# Patient Record
Sex: Male | Born: 1975 | Race: White | Hispanic: No | Marital: Married | Smoking: Never smoker
Health system: Southern US, Community
[De-identification: ages and names within clinical notes are randomized; demographics above are authoritative.]

## PROBLEM LIST (undated history)

## (undated) HISTORY — PX: BACK SURGERY: SHX140

---

## 2004-11-23 ENCOUNTER — Emergency Department: Payer: Self-pay | Admitting: Emergency Medicine

## 2004-12-04 ENCOUNTER — Emergency Department: Payer: Self-pay | Admitting: Emergency Medicine

## 2006-03-23 ENCOUNTER — Emergency Department: Payer: Self-pay | Admitting: Unknown Physician Specialty

## 2012-04-27 ENCOUNTER — Emergency Department: Payer: Self-pay | Admitting: Emergency Medicine

## 2013-10-08 ENCOUNTER — Ambulatory Visit: Payer: Self-pay

## 2013-10-08 LAB — RAPID INFLUENZA A&B ANTIGENS

## 2015-04-07 ENCOUNTER — Ambulatory Visit: Payer: 59

## 2015-04-07 ENCOUNTER — Ambulatory Visit
Admission: EM | Admit: 2015-04-07 | Discharge: 2015-04-07 | Disposition: A | Discharge status: Home / Self Care | Payer: 59 | Attending: Internal Medicine | Admitting: Internal Medicine

## 2015-04-07 ENCOUNTER — Encounter: Payer: Self-pay | Admitting: Emergency Medicine

## 2015-04-07 DIAGNOSIS — R1032 Left lower quadrant pain: Secondary | ICD-10-CM

## 2015-04-07 DIAGNOSIS — R109 Unspecified abdominal pain: Secondary | ICD-10-CM | POA: Diagnosis not present

## 2015-04-07 DIAGNOSIS — K5732 Diverticulitis of large intestine without perforation or abscess without bleeding: Secondary | ICD-10-CM

## 2015-04-07 DIAGNOSIS — Z79899 Other long term (current) drug therapy: Secondary | ICD-10-CM | POA: Diagnosis not present

## 2015-04-07 MED ORDER — CIPROFLOXACIN HCL 500 MG PO TABS: 500.0000 mg | ORAL_TABLET | Freq: Two times a day (BID) | ORAL | Status: DC

## 2015-04-07 MED ORDER — METRONIDAZOLE 500 MG PO TABS: 500.0000 mg | ORAL_TABLET | Freq: Three times a day (TID) | ORAL | Status: DC

## 2015-04-07 NOTE — Discharge Instructions (Signed)

## 2015-04-07 NOTE — ED Notes (Signed)
Patient states he "began having left lower abdominal pain on Tuesday, it has gradually gotten worse, movement makes it worse"

## 2015-04-07 NOTE — ED Provider Notes (Signed)
CSN: [id number]     Arrival date & time 04/07/15  1249 History   First MD Initiated Contact with Patient 04/07/15 1318     Chief Complaint  Patient presents with  . Abdominal Pain   (Consider location/radiation/quality/duration/timing/severity/associated sxs/prior Treatment) HPI  This a 39 year old male who presents with the acute onset on Tuesday, 2 days ago, of left lower quadrant abdominal pain. He indicates the periumbilical level on the left side. He denies any radiation of the pain but the pain is certainly exacerbated motion of changing positions especially when attempting to lie flat. His had nausea but no vomiting. Is also had some loose stools with blood but this sometimes happens because of a hemorrhoid that he has had. He has noticed that he has to have a bowel movement shortly after eating usually in the 30 minute time span. Last night he was miserable was unable to sleep because he tossed and turned all night with the pain. He states that they have been eating a large amount of corn along with other farm vegetables. Denies any fever or chills. He had been seen about 1 year ago with intestinal issues which initially was thought to be parasites.. Performed by gastroenterologist at the [facility]. History reviewed. No pertinent past medical history. Past Surgical History  Procedure Laterality Date  . Back surgery     History reviewed. No pertinent family history. History  Substance Use Topics  . Smoking status: Never Smoker   . Smokeless tobacco: Former Neurosurgeon  . Alcohol Use: Yes     Comment: occasionally    Review of Systems  Gastrointestinal: Positive for nausea and abdominal pain.  All other systems reviewed and are negative.   Allergies  Review of patient's allergies indicates no known allergies.  Home Medications   Prior to Admission medications   Medication Sig Start Date End Date Taking? Authorizing Provider  ciprofloxacin (CIPRO) 500 MG tablet Take 1 tablet  (500 mg total) by mouth 2 (two) times daily. 04/07/15   Lutricia Feil, PA-C  metroNIDAZOLE (FLAGYL) 500 MG tablet Take 1 tablet (500 mg total) by mouth 3 (three) times daily. 04/07/15   Chrissie Noa Annalese Stiner, PA-C   BP 132/77 mmHg  Pulse 78  Temp(Src) 97.6 F (36.4 C) (Oral)  Resp 16  Ht 5\' 10"  (1.778 m)  Wt 195 lb (88.451 kg)  BMI 27.98 kg/m2  SpO2 98% Physical Exam  Constitutional: He is oriented to person, place, and time. He appears well-developed and well-nourished. No distress.  HENT:  Head: Normocephalic and atraumatic.  Neck: Neck supple. No thyromegaly present.  Cardiovascular: Normal rate, regular rhythm, normal heart sounds and intact distal pulses.  Exam reveals no gallop and no friction rub.   No murmur heard. Pulmonary/Chest: Breath sounds normal. No stridor. No respiratory distress. He has no wheezes. He has no rales.  Abdominal: Soft. He exhibits no distension. There is tenderness. There is guarding. There is no rebound.  Examination the abdomen shows the patient to go slowly from a sitting to a lying position because of pain. He grimaces throughout the maneuver. Abdomen does not appear distended but the abdominal wall is tense but not guarded. He prefers to have his legs drawn up flexing at the hips to take off the pressure on the abdominal wall. Bowel sounds are relatively absent in the left lower quadrant but present in the remainder of the bowel although hypotonic. The tenderness is in the left side maximal just distal to the periumbilical area sharply  localized and a small area of approximately 5 cm or so. He demonstrates no rebound ,Rosvick's sign or any masses.  Lymphadenopathy:    He has no cervical adenopathy.  Neurological: He is alert and oriented to person, place, and time.  Skin: Skin is warm and dry. No rash noted. He is not diaphoretic.  Psychiatric: He has a normal mood and affect. His behavior is normal. Judgment and thought content normal.  Nursing note and  vitals reviewed.   ED Course  Procedures (including critical care time) Labs Review Labs Reviewed - No data to display  Imaging Review Dg Abd Acute W/chest  04/07/2015   CLINICAL DATA:  Left lower quadrant pain for 3 days  EXAM: DG ABDOMEN ACUTE W/ 1V CHEST  COMPARISON:  None.  FINDINGS: No definite active infiltrate or effusion is seen. A vague opacity in the right upper lung field may correspond to a healing fracture involving the right anterior second rib but comparison with prior chest x-ray or followup chest x-ray is recommended if available. Mediastinal and hilar contours are unremarkable. The heart is within normal limits in size.  Supine and erect views of the abdomen show no bowel obstruction. A moderate amount of feces is present in the right colon. No free air is noted on the erect view. Hardware for fusion from T12 -L2 is noted across a partially compressed L1 vertebral body.  IMPRESSION: 1. No active lung disease. Question healing fracture of the anterior right second rib. Compare with any prior or followup chest x-ray. 2. No bowel obstruction or free air. 3. Moderate amount of feces in the right colon.   Electronically Signed   By: Dwyane Dee M.D.   On: 04/07/2015 14:03     MDM   1. Abdominal pain, acute, left lower quadrant   2. Diverticulitis of large intestine without perforation or abscess without bleeding    New Prescriptions   CIPROFLOXACIN (CIPRO) 500 MG TABLET    Take 1 tablet (500 mg total) by mouth 2 (two) times daily.   METRONIDAZOLE (FLAGYL) 500 MG TABLET    Take 1 tablet (500 mg total) by mouth 3 (three) times daily.  Plan: 1. Test/x-ray results and diagnosis reviewed with patient 2. rx as per orders; risks, benefits, potential side effects reviewed with patient 3. Recommend supportive treatment with fiber diet, ibuprofen 4. F/u back here at [facility] urgent care if not having any improvement within 24 hours or not showing any progress in 48 hours. He has increasing  pain despite the medication he was advised to go to the emergency department if we are not available.  Lutricia Feil, PA-C 04/08/15 [phone]

## 2015-05-02 ENCOUNTER — Inpatient Hospital Stay: Payer: Self-pay

## 2015-05-16 ENCOUNTER — Inpatient Hospital Stay: Payer: 59 | Attending: Family Medicine | Admitting: *Deleted

## 2015-05-16 ENCOUNTER — Encounter: Payer: Self-pay | Admitting: Oncology

## 2015-05-16 NOTE — Progress Notes (Addendum)
39 year old White male presents to the clinic for genetic testing only.  Discussed case earlier with Dr. Oliva Bustard.  Patient's mom is a patient of Dr. Metro Kung and has a history of ovarian cancer. She has a BRCA positive mutation.  Based on the following family history, the patient qualifies per NCCN guidelines for genetic testing:  Mom BRCA positive ovarian cancer at age 31, (77) maternal 2nd cousins with breast cancer in their 46's, maternal 2nd cousin with bladder cancer in his 53's, maternal great uncle with with prostate cancer in his 2's, maternal great uncle with colon cancer in his 7's, maternal great  uncle with melanoma, maternal great aunt with ovarian cancer age unknown, maternal great grandmother with a brain tumor, and a maternal great grandmother with peritoneal cancer and melanoma, age unknown.  Blood specimen collected via the lab for [facility] genetic testing.  Explained that results may take 3 weeks, and that we would schedule him an appointment to follow up with Dr. Oliva Bustard for the results and any recommendations.  Specimen shipped to [facility] via [employer].  Offered patient an opportunity to ask questions.

## 2015-06-06 ENCOUNTER — Telehealth: Payer: Self-pay | Admitting: *Deleted

## 2015-06-06 NOTE — Telephone Encounter (Signed)
Called and informed patient of his negative genetic test results.  Encouraged him to talk with him family doctor and to follow screening guidelines based on his family history.  He is agreeable.  Mailed copy of results to his home.  Offered him an opportunity to ask questions.

## 2016-05-11 ENCOUNTER — Ambulatory Visit
Admission: EM | Admit: 2016-05-11 | Discharge: 2016-05-11 | Disposition: A | Discharge status: Home / Self Care | Payer: 59 | Attending: Family Medicine | Admitting: Family Medicine

## 2016-05-11 DIAGNOSIS — S161XXA Strain of muscle, fascia and tendon at neck level, initial encounter: Secondary | ICD-10-CM

## 2016-05-11 MED ORDER — TIZANIDINE HCL 4 MG PO TABS: 4.0000 mg | ORAL_TABLET | Freq: Three times a day (TID) | ORAL | 0 refills | Status: DC

## 2016-05-11 MED ORDER — MELOXICAM 15 MG PO TABS: 15.0000 mg | ORAL_TABLET | Freq: Every day | ORAL | 0 refills | Status: DC

## 2016-05-11 MED ORDER — KETOROLAC TROMETHAMINE 60 MG/2ML IM SOLN
60.0000 mg | Freq: Once | INTRAMUSCULAR | Status: AC
  Administered 2016-05-11: 60 mg via INTRAMUSCULAR

## 2016-05-11 NOTE — ED Provider Notes (Signed)
CSN: [id number]     Arrival date & time 05/11/16  1205 History   First MD Initiated Contact with Patient 05/11/16 1249     Chief Complaint  Patient presents with  . Headache   (Consider location/radiation/quality/duration/timing/severity/associated sxs/prior Treatment) HPI  This a 40 year old male who presents with 3 days of tightness in his neck mostly right sided with radiation into the occipital area. He states that it hurts to move particularly in rotation to the right side. His had no injury. He thought that it might be a migraine headache and took one of his wife's Imitrex last night but it did not seem to help. At the present time he states that it is 710 intensity. He denies any upper extremity radicular symptoms. Has no pain into his trapezii or into his upper back. He does sleep on a foam pillow and has so for the last year and half. He does have a tendency of lying on the couch with his head propped up on pillows to watch television.      History reviewed. No pertinent past medical history. Past Surgical History:  Procedure Laterality Date  . BACK SURGERY     History reviewed. No pertinent family history. Social History  Substance Use Topics  . Smoking status: Never Smoker  . Smokeless tobacco: Former NeurosurgeonUser  . Alcohol use Yes     Comment: occasionally    Review of Systems  Constitutional: Positive for activity change. Negative for appetite change, chills, fatigue and fever.  Gastrointestinal: Positive for nausea and vomiting.  Musculoskeletal: Positive for neck pain.  All other systems reviewed and are negative.   Allergies  Review of patient's allergies indicates no known allergies.  Home Medications   Prior to Admission medications   Medication Sig Start Date End Date Taking? Authorizing Provider  meloxicam (MOBIC) 15 MG tablet Take 1 tablet (15 mg total) by mouth daily. 05/11/16   Lutricia FeilWilliam P Giani Winther, PA-C  tiZANidine (ZANAFLEX) 4 MG tablet Take 1 tablet (4 mg  total) by mouth 3 (three) times daily. As necessary for neck spasm 05/11/16   Lutricia FeilWilliam P Jaasiel Hollyfield, PA-C   Meds Ordered and Administered this Visit   Medications  ketorolac (TORADOL) injection 60 mg (60 mg Intramuscular Given 05/11/16 1308)    BP 120/86 (BP Location: Right Arm)   Pulse 70   Temp 97.8 F (36.6 C) (Oral)   Resp 18   Ht 5\' 11"  (1.803 m)   Wt 201 lb (91.2 kg)   SpO2 100%   BMI 28.03 kg/m  No data found.   Physical Exam  Constitutional: He is oriented to person, place, and time. He appears well-developed and well-nourished. No distress.  HENT:  Head: Normocephalic and atraumatic.  Eyes: EOM are normal. Pupils are equal, round, and reactive to light. Right eye exhibits no discharge. Left eye exhibits no discharge.  Neck: Normal range of motion. Neck supple.  Semination the cervical spine shows a good range of motion of the cervical spine with discomfort in extremes particularly with right rotation with extension and with lateral flexion and extension on the right. He does have tenderness in the paraspinous muscles of the cervical spine at approximately C5-C6 level. There is no trapezial tenderness there is no upper back tenderness that is palpated upper extremity strength and sensation is intact.  Musculoskeletal: Normal range of motion. He exhibits tenderness. He exhibits no edema or deformity.  Neurological: He is alert and oriented to person, place, and time. He has normal reflexes.  He displays normal reflexes. No cranial nerve deficit. He exhibits normal muscle tone. Coordination normal.  Skin: Skin is warm and dry. He is not diaphoretic.  Psychiatric: He has a normal mood and affect. His behavior is normal. Judgment and thought content normal.  Nursing note and vitals reviewed.   Urgent Care Course   Clinical Course    Procedures (including critical care time)  Labs Review Labs Reviewed - No data to display  Imaging Review No results found.   Visual Acuity  Review  Right Eye Distance:   Left Eye Distance:   Bilateral Distance:    Right Eye Near:   Left Eye Near:    Bilateral Near:     Medications  ketorolac (TORADOL) injection 60 mg (60 mg Intramuscular Given 05/11/16 1308)   Patient's pain level decreased from an 8 out of 10 to a 4 out of 10 after the Toradol injection   MDM   1. Cervical strain, acute, initial encounter    Current Discharge Medication List    START taking these medications   Details  meloxicam (MOBIC) 15 MG tablet Take 1 tablet (15 mg total) by mouth daily. Qty: 30 tablet, Refills: 0    tiZANidine (ZANAFLEX) 4 MG tablet Take 1 tablet (4 mg total) by mouth 3 (three) times daily. As necessary for neck spasm Qty: 30 tablet, Refills: 0      Plan: 1. Test/x-ray results and diagnosis reviewed with patient 2. rx as per orders; risks, benefits, potential side effects reviewed with patient 3. Recommend supportive treatment with Symptom avoidance. Commended he experiment with different pillows to decrease the loft thereby improving his neck discomfort. He should also refrain from the with his head propped up on pillows. I've cautioned him with regard to the muscle relaxant could affect his concentration and judgment. He should not drive while taking the medication. If he is not improving he can return to our clinic or follow-up with a primary care physician 4. F/u prn if symptoms worsen or don't improve     Lutricia Feil, PA-C 05/11/16 1349

## 2016-05-11 NOTE — ED Triage Notes (Signed)
Pt reports 3 days of tightness in neck and moving up onto scalp to back of head. Tried Imitrex that his wife has but didn't help.  Has vomited twice today. Pain 7/10. Has had photophobia.

## 2016-05-24 ENCOUNTER — Ambulatory Visit
Admission: EM | Admit: 2016-05-24 | Discharge: 2016-05-24 | Disposition: A | Discharge status: Home / Self Care | Payer: 59 | Attending: Emergency Medicine | Admitting: Emergency Medicine

## 2016-05-24 ENCOUNTER — Encounter: Payer: Self-pay | Admitting: *Deleted

## 2016-05-24 ENCOUNTER — Ambulatory Visit (INDEPENDENT_AMBULATORY_CARE_PROVIDER_SITE_OTHER): Payer: 59

## 2016-05-24 DIAGNOSIS — J189 Pneumonia, unspecified organism: Secondary | ICD-10-CM | POA: Diagnosis not present

## 2016-05-24 MED ORDER — PREDNISONE 50 MG PO TABS
60.0000 mg | ORAL_TABLET | Freq: Once | ORAL | Status: AC
  Administered 2016-05-24: 60 mg via ORAL

## 2016-05-24 MED ORDER — AEROCHAMBER PLUS MISC: 2 refills | Status: DC

## 2016-05-24 MED ORDER — IPRATROPIUM BROMIDE 0.06 % NA SOLN: 2.0000 | Freq: Four times a day (QID) | NASAL | 0 refills | Status: DC

## 2016-05-24 MED ORDER — IPRATROPIUM-ALBUTEROL 0.5-2.5 (3) MG/3ML IN SOLN
3.0000 mL | Freq: Once | RESPIRATORY_TRACT | Status: AC
  Administered 2016-05-24: 3 mL via RESPIRATORY_TRACT

## 2016-05-24 MED ORDER — PREDNISONE 50 MG PO TABS: 50.0000 mg | ORAL_TABLET | Freq: Every day | ORAL | 0 refills | Status: DC

## 2016-05-24 MED ORDER — AZITHROMYCIN 250 MG PO TABS: 250.0000 mg | ORAL_TABLET | Freq: Every day | ORAL | 0 refills | Status: DC

## 2016-05-24 MED ORDER — HYDROCOD POLST-CPM POLST ER 10-8 MG/5ML PO SUER: 5.0000 mL | Freq: Two times a day (BID) | ORAL | 0 refills | Status: DC | PRN

## 2016-05-24 NOTE — ED Triage Notes (Signed)
Productive cough- green, runny nose, watery eyes, fatigue x1 week.

## 2016-05-24 NOTE — ED Provider Notes (Signed)
HPI  SUBJECTIVE:  Dennis Douglas is a 40 y.o. male who presents with nasal congestion, rhinorrhea, postnasal drip, fatigue for the past week. He states that he has cough productive of white mucous and chest congestion. Reports shortness of breath with exertion and wheezing. States he is unable to sleep at night secondary to the coughing. He states that he is overall not getting better but denies double sickening. He has tried increase fluids, rest, neti pot and Benadryl. He had a virtual visit with a physician 3 days ago, was prescribed an inhaler, Mucinex D and Flonase. States that this is not helping. Does not have a spacer. Symptoms are better with rest, worse with exposure to paint fumes. He denies allergy symptoms. No sick contacts. No nausea, vomiting, fevers. No sinus pain or pressure, dental pain, ear pain. States his ears feel "full". No antipyretic in the past 6-8 hours. He has a past medical history of asthma in childhood. He is not a smoker. No history of diabetes, hypertension, seasonal allergies. PMD: None.    History reviewed. No pertinent past medical history.  Past Surgical History:  Procedure Laterality Date  . BACK SURGERY      History reviewed. No pertinent family history.  Social History  Substance Use Topics  . Smoking status: Never Smoker  . Smokeless tobacco: Former Neurosurgeon  . Alcohol use Yes     Comment: occasionally    No current facility-administered medications for this encounter.   Current Outpatient Prescriptions:  .  azithromycin (ZITHROMAX) 250 MG tablet, Take 1 tablet (250 mg total) by mouth daily. 2 tabs po on day 1, 1 tab po on days 2-5, Disp: 6 tablet, Rfl: 0 .  chlorpheniramine-HYDROcodone (TUSSIONEX PENNKINETIC ER) 10-8 MG/5ML SUER, Take 5 mLs by mouth every 12 (twelve) hours as needed for cough., Disp: 120 mL, Rfl: 0 .  predniSONE (DELTASONE) 50 MG tablet, Take 1 tablet (50 mg total) by mouth daily with breakfast., Disp: 4 tablet, Rfl: 0 .   Spacer/Aero-Holding Chambers (AEROCHAMBER PLUS) inhaler, Use as instructed, Disp: 1 each, Rfl: 2  No Known Allergies   ROS  As noted in HPI.   Physical Exam  BP 131/89 (BP Location: Left Arm)   Pulse 96   Temp 98.9 F (37.2 C) (Oral)   Resp 16   Ht 5\' 11"  (1.803 m)   Wt 200 lb (90.7 kg)   SpO2 99%   BMI 27.89 kg/m   Constitutional: Well developed, well nourished, no acute distress Eyes: PERRL, EOMI, conjunctiva normal bilaterally HENT: Normocephalic, atraumatic,mucus membranes moist. TMs normal. Positive nasal congestion. No sinus tenderness. Oropharynx normal. No Postnasal drip.  Respiratory: Fair air movement. Positive wheezing and rhonchi on the right side. Cardiovascular: Normal rate and rhythm, no murmurs, no gallops, no rubs GI:  nondistended,  skin: No rash, skin intact Musculoskeletal: , no deformities Neurologic: Alert & oriented x 3, CN II-XII grossly intact, no motor deficits, sensation grossly intact Psychiatric: Speech and behavior appropriate   ED Course   Medications  ipratropium-albuterol (DUONEB) 0.5-2.5 (3) MG/3ML nebulizer solution 3 mL (3 mLs Nebulization Given 05/24/16 2125)  predniSONE (DELTASONE) tablet 60 mg (60 mg Oral Given 05/24/16 2124)    Orders Placed This Encounter  Procedures  . DG Chest 2 View    Standing Status:   Standing    Number of Occurrences:   1    Order Specific Question:   Reason for Exam (SYMPTOM  OR DIAGNOSIS REQUIRED)    Answer:   r/o  PNA   No results found for this or any previous visit (from the past 24 hour(s)). Dg Chest 2 View  Result Date: 05/24/2016 CLINICAL DATA:  One week history of productive cough. EXAM: CHEST  2 VIEW COMPARISON:  04/07/2015 FINDINGS: The cardiac silhouette, mediastinal and hilar contours are normal. There is a right upper and middle lobe infiltrate. The left lung is clear. No pleural effusions or pneumothorax. The bony thorax is intact. IMPRESSION: Right middle lobe pneumonia. Electronically  Signed   By: Rudie MeyerP.  Gallerani M.D.   On: 05/24/2016 21:45    ED Clinical Impression  CAP (community acquired pneumonia)   ED Assessment/Plan  Reviewed imaging independently. Right middle lobe pneumonia. See radiology report for full details.  Gave DuoNeb, 60 mg of prednisone for presumed bronchospasm.   On reevaluation, patient states that he feels better. He still has rales and rhonchi in the right middle lobe, and wheezing on the left side. He has improved air movement.  Plan to send home with a spacer for him to use with his albuterol inhaler, continue Mucinex D, DC Flonase, start Atrovent nasal spray, 4 more days of prednisone, azithromycin. Sending home with steroids as he has a history of asthma. Will provide primary care referral.  Discussed  imaging, MDM, plan and followup with patient. Discussed sn/sx that should prompt return to the ED. Patient agrees with plan.   Meds ordered this encounter  Medications  . ipratropium-albuterol (DUONEB) 0.5-2.5 (3) MG/3ML nebulizer solution 3 mL  . predniSONE (DELTASONE) tablet 60 mg  . azithromycin (ZITHROMAX) 250 MG tablet    Sig: Take 1 tablet (250 mg total) by mouth daily. 2 tabs po on day 1, 1 tab po on days 2-5    Dispense:  6 tablet    Refill:  0  . chlorpheniramine-HYDROcodone (TUSSIONEX PENNKINETIC ER) 10-8 MG/5ML SUER    Sig: Take 5 mLs by mouth every 12 (twelve) hours as needed for cough.    Dispense:  120 mL    Refill:  0  . Spacer/Aero-Holding Chambers (AEROCHAMBER PLUS) inhaler    Sig: Use as instructed    Dispense:  1 each    Refill:  2  . predniSONE (DELTASONE) 50 MG tablet    Sig: Take 1 tablet (50 mg total) by mouth daily with breakfast.    Dispense:  4 tablet    Refill:  0    *This clinic note was created using Scientist, clinical (histocompatibility and immunogenetics)Dragon dictation software. Therefore, there may be occasional mistakes despite careful proofreading.  ?   Domenick GongAshley Marai Teehan, MD 05/24/16 2202

## 2016-05-24 NOTE — Discharge Instructions (Signed)
2 puffs from your albuterol inhaler every 4-6 hours as needed for coughing and wheezing and chest tightness. Use your spacer when he used her albuterol. Start the steroids tomorrow as you have artery taken today's dose. Finish the antibiotics. Up with a primary care physician of your choice. Go to the ER if you get worse.

## 2017-05-15 IMAGING — CR DG ABDOMEN ACUTE W/ 1V CHEST
4 series · 4 of 4 positions shown · non-contrast
Comparison: None.

CLINICAL DATA: Left lower quadrant pain for 3 days

EXAM:
DG ABDOMEN ACUTE W/ 1V CHEST

[chest pa]
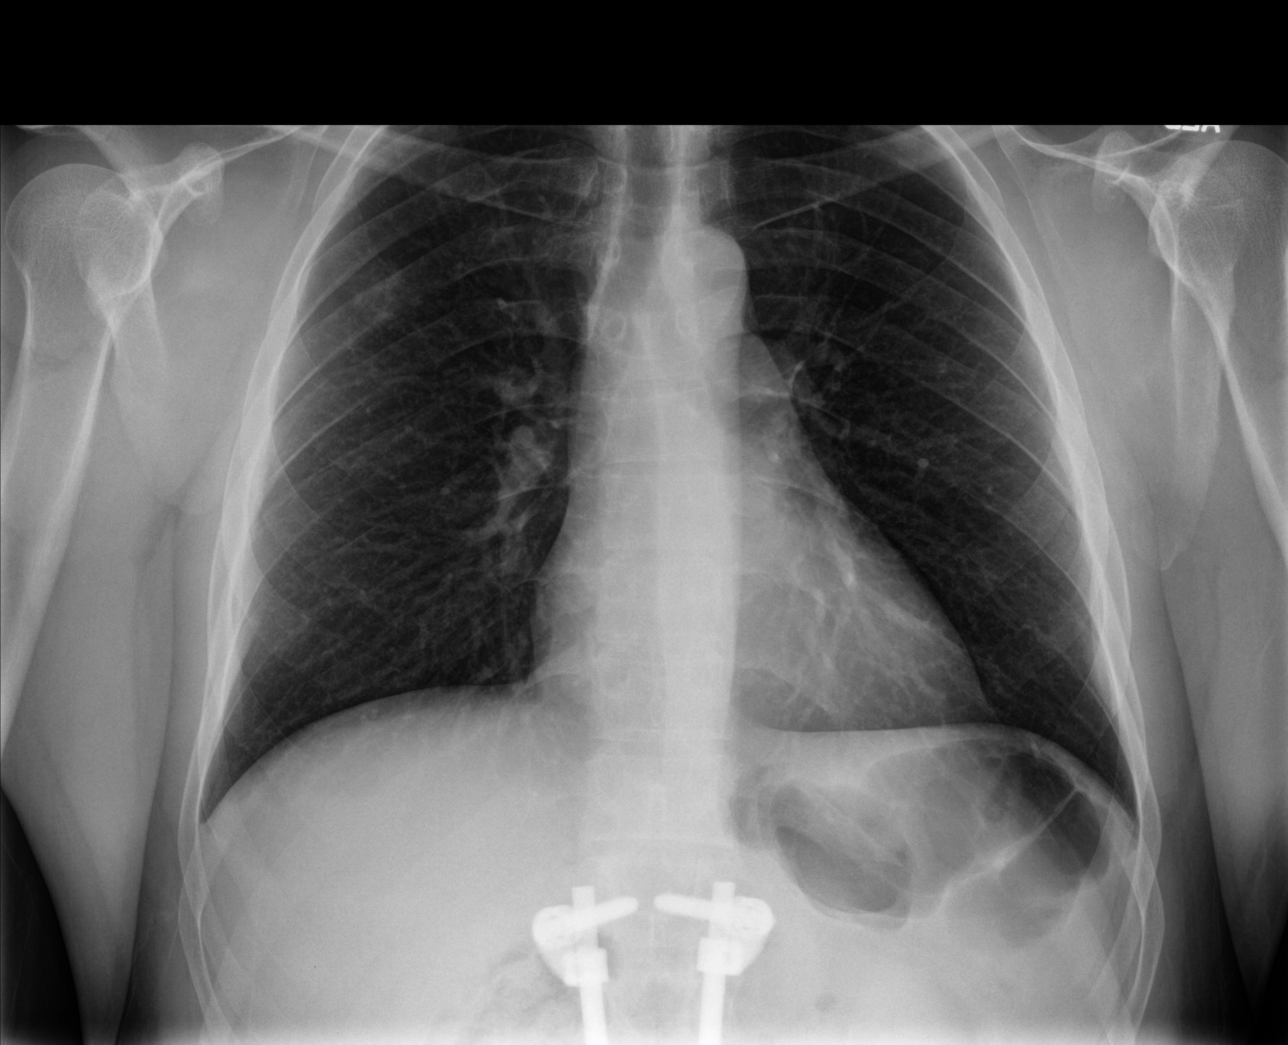

[abdomen erect]
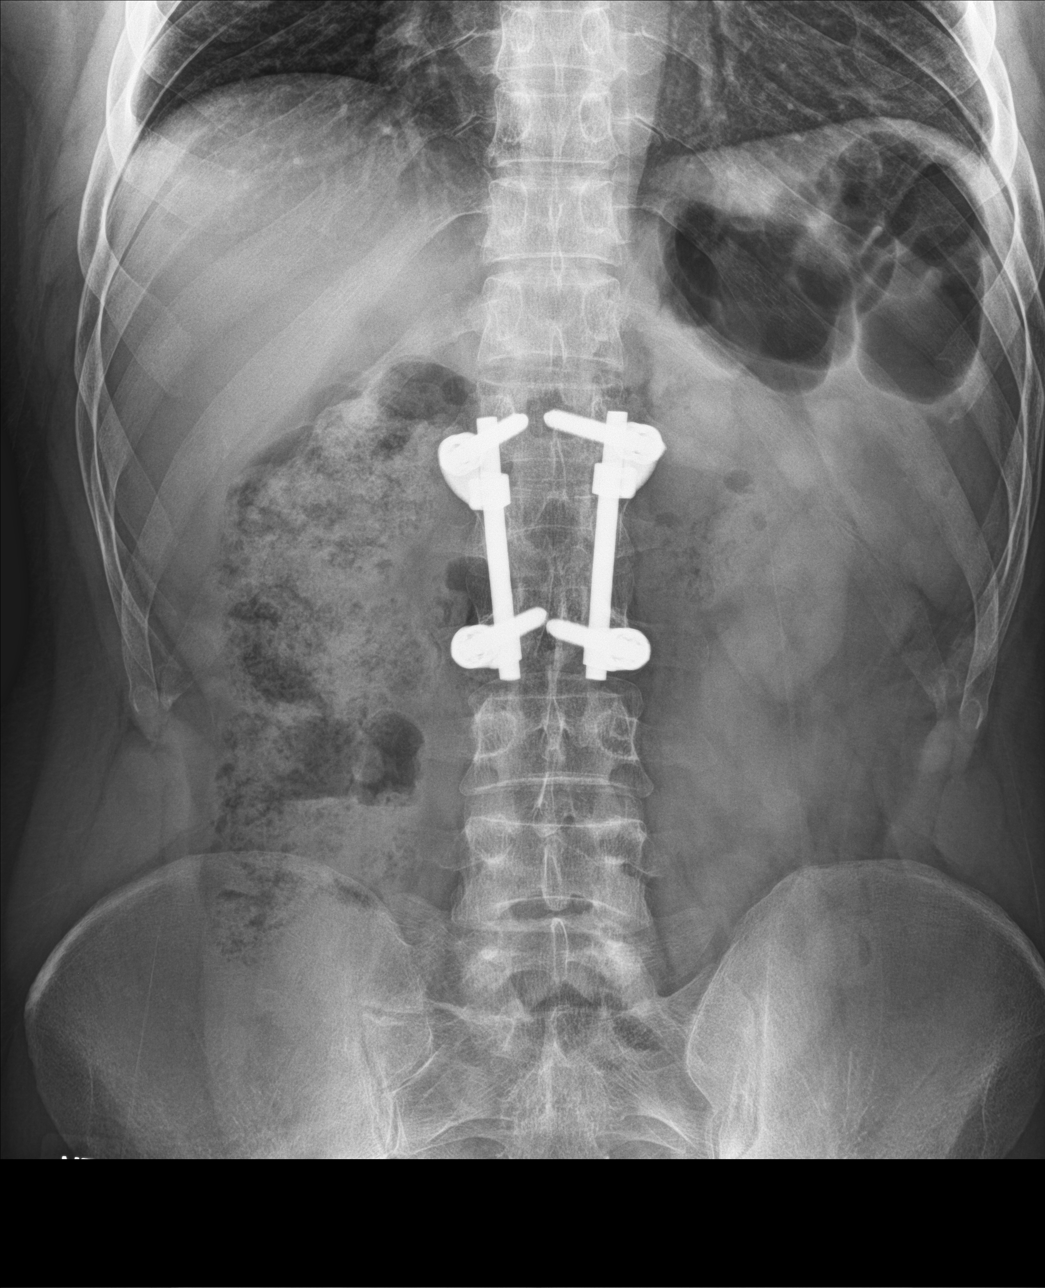

[abdomen supine (1 of 2)]
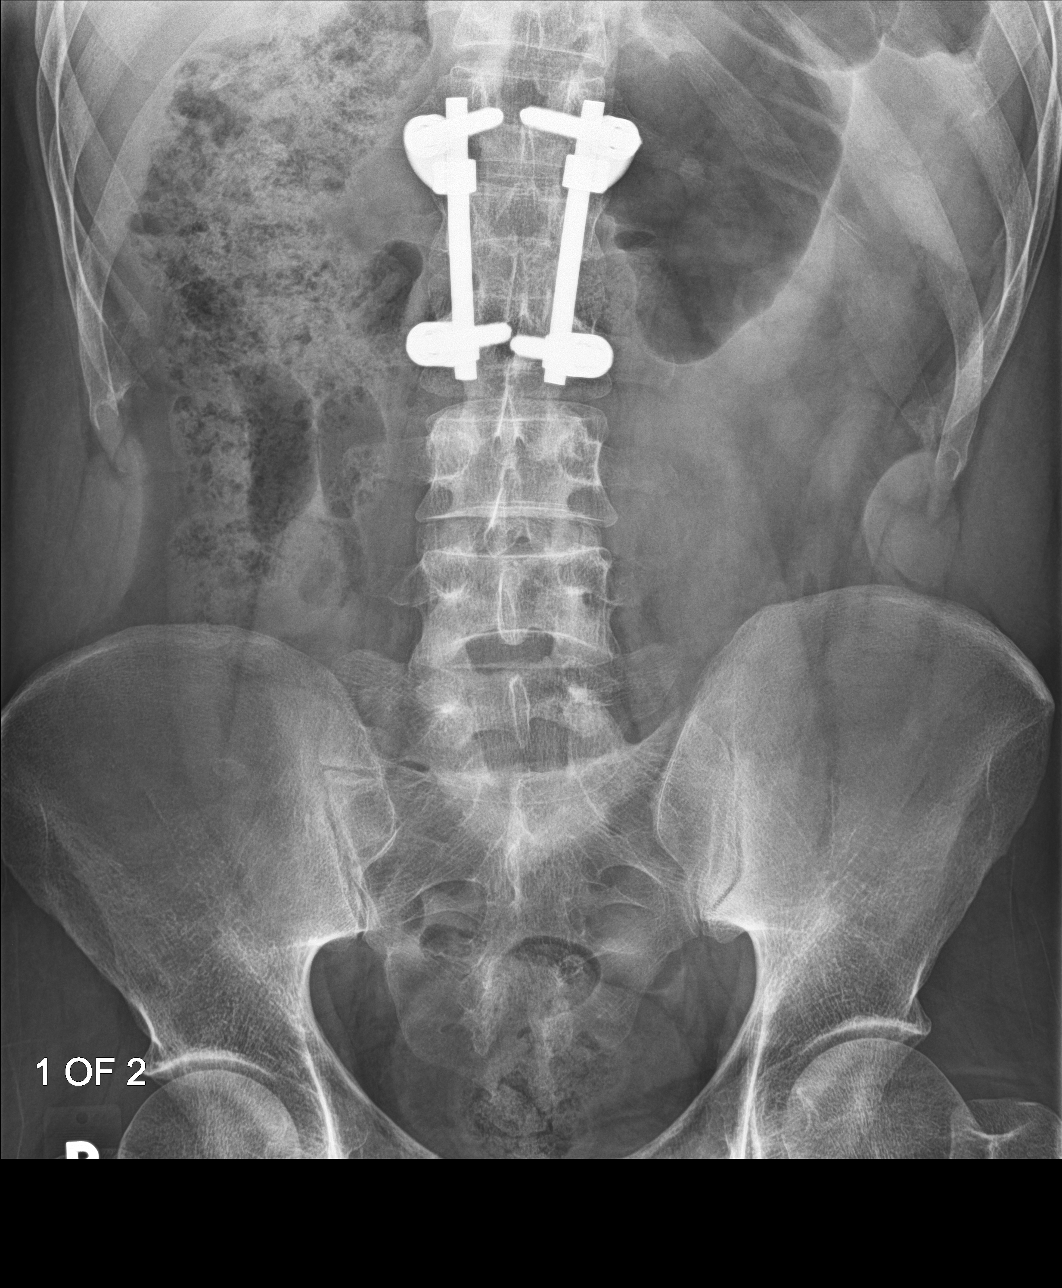

[abdomen supine (2 of 2)]
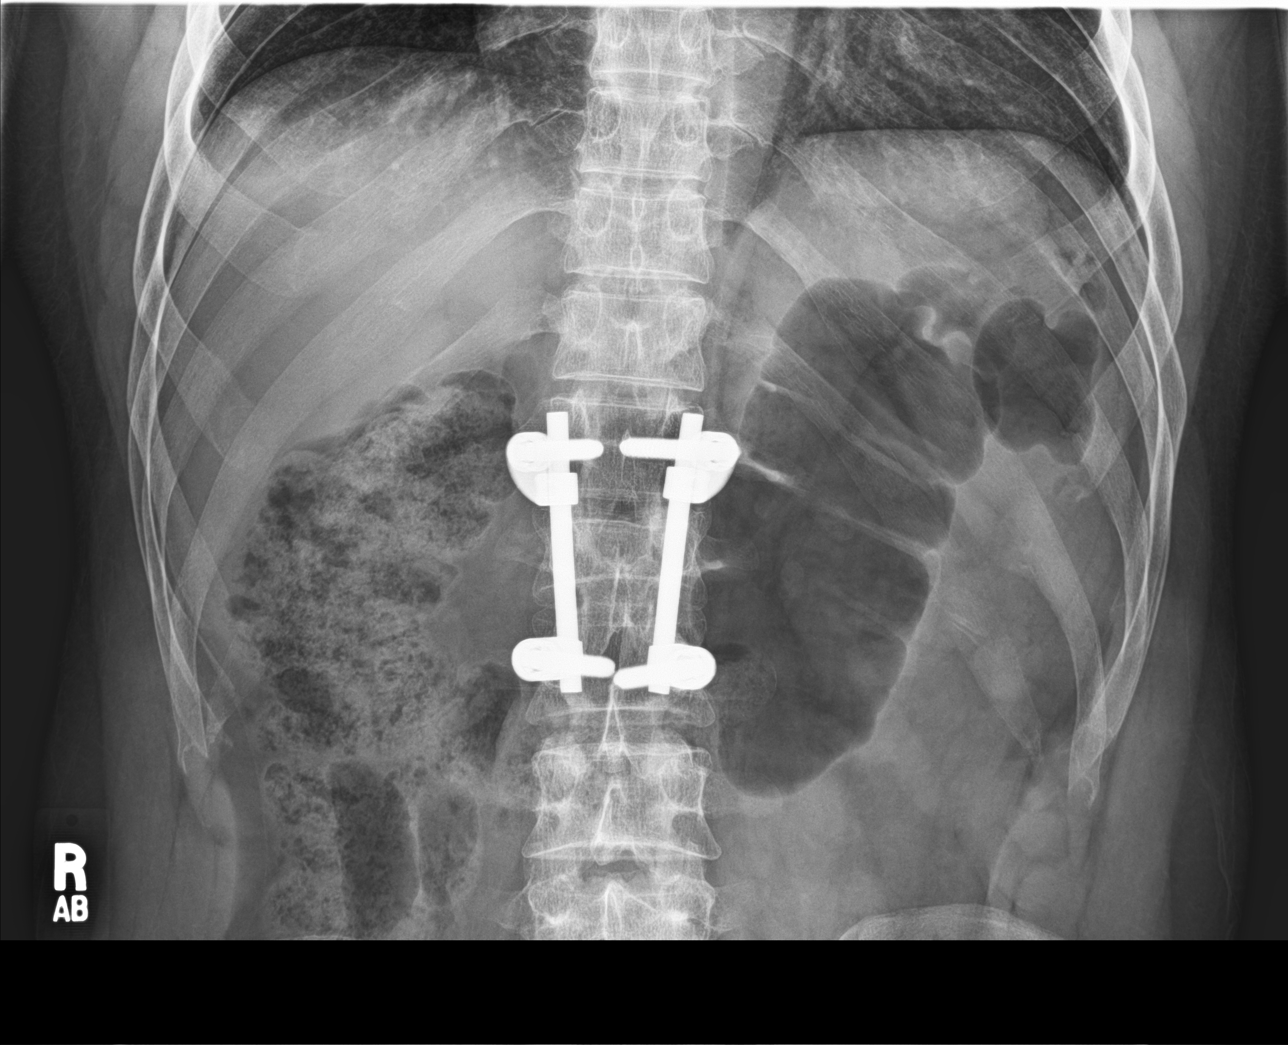

[4 of 4 positions shown; findings below may reference images not displayed]

FINDINGS: No definite active infiltrate or effusion is seen. A vague opacity
in the right upper lung field may correspond to a healing fracture
involving the right anterior second rib but comparison with prior
chest x-ray or followup chest x-ray is recommended if available.
Mediastinal and hilar contours are unremarkable. The heart is within
normal limits in size.

Supine and erect views of the abdomen show no bowel obstruction. A
moderate amount of feces is present in the right colon. No free air
is noted on the erect view. Hardware for fusion from T12 -L2 is
noted across a partially compressed L1 vertebral body.
IMPRESSION: 1. No active lung disease. Question healing fracture of the anterior
right second rib. Compare with any prior or followup chest x-ray.
2. No bowel obstruction or free air.
3. Moderate amount of feces in the right colon.

## 2017-11-15 ENCOUNTER — Ambulatory Visit
Admission: EM | Admit: 2017-11-15 | Discharge: 2017-11-15 | Disposition: A | Discharge status: Home / Self Care | Payer: Managed Care, Other (non HMO) | Attending: Family Medicine | Admitting: Family Medicine

## 2017-11-15 DIAGNOSIS — J069 Acute upper respiratory infection, unspecified: Secondary | ICD-10-CM | POA: Diagnosis not present

## 2017-11-15 MED ORDER — AZITHROMYCIN 250 MG PO TABS: 250.0000 mg | ORAL_TABLET | Freq: Every day | ORAL | 0 refills | Status: AC

## 2017-11-15 MED ORDER — BENZONATATE 200 MG PO CAPS: ORAL_CAPSULE | ORAL | 0 refills | Status: AC

## 2017-11-15 NOTE — ED Triage Notes (Signed)
As per patient coughing, wheezing, clear to greenish mucus onset 5 days around sick family members.

## 2017-11-15 NOTE — ED Provider Notes (Signed)
[facility]    CSN: [id number] Arrival date & time: 11/15/17  1904     History   Chief Complaint Chief Complaint  Patient presents with  . Cough    HPI Dennis Douglas is a 42 y.o. male.   HPI  42 year old male presents with coughing wheezing scant greenish mucus that happened around 5 days ago.  He said his wife and kids have had the same problem.  I had treated him for community-acquired pneumonia last year and he states that is not nearly as bad as that was.  A mild amount of sinus congestion.  Had any fever or chills.  Does have wheezing particularly first thing in the morning        No past medical history on file.  There are no active problems to display for this patient.   Past Surgical History:  Procedure Laterality Date  . BACK SURGERY         Home Medications    Prior to Admission medications   Medication Sig Start Date End Date Taking? Authorizing Provider  azithromycin (ZITHROMAX) 250 MG tablet Take 1 tablet (250 mg total) by mouth daily. Take first 2 tablets together, then 1 every day until finished. 11/15/17   Lutricia Feil, PA-C  benzonatate (TESSALON) 200 MG capsule Take one cap TID PRN cough 11/15/17   Lutricia Feil, PA-C    Family History No family history on file.  Social History Social History   Tobacco Use  . Smoking status: Never Smoker  . Smokeless tobacco: Former Engineer, water Use Topics  . Alcohol use: Yes    Comment: occasionally  . Drug use: No     Allergies   Patient has no known allergies.   Review of Systems Review of Systems  Constitutional: Positive for activity change. Negative for chills, fatigue and fever.  Respiratory: Positive for cough and wheezing.   All other systems reviewed and are negative.    Physical Exam Triage Vital Signs ED Triage Vitals  Enc Vitals Group     BP 11/15/17 1927 123/82     Pulse --      Resp 11/15/17 1927 16     Temp 11/15/17 1927 99 F (37.2 C)     Temp Source 11/15/17 1927 Oral     SpO2 11/15/17 1927 97 %     Weight 11/15/17 1924 195 lb (88.5 kg)     Height 11/15/17 1924 5\' 10"  (1.778 m)     Head Circumference --      Peak Flow --      Pain Score 11/15/17 1924 5     Pain Loc --      Pain Edu? --      Excl. in GC? --    No data found.  Updated Vital Signs BP 123/82 (BP Location: Left Arm)   Temp 99 F (37.2 C) (Oral)   Resp 16   Ht 5\' 10"  (1.778 m)   Wt 195 lb (88.5 kg)   SpO2 97%   BMI 27.98 kg/m   Visual Acuity Right Eye Distance:   Left Eye Distance:   Bilateral Distance:    Right Eye Near:   Left Eye Near:    Bilateral Near:     Physical Exam  Constitutional: He is oriented to person, place, and time. He appears well-developed and well-nourished. No distress.  HENT:  Head: Normocephalic.  Right Ear: External ear normal.  Left Ear: External ear normal.  Nose: Nose  normal.  Mouth/Throat: Oropharynx is clear and moist. No oropharyngeal exudate.  Eyes: Pupils are equal, round, and reactive to light. Right eye exhibits no discharge. Left eye exhibits no discharge.  Neck: Normal range of motion.  Pulmonary/Chest: Effort normal and breath sounds normal.  Musculoskeletal: Normal range of motion.  Lymphadenopathy:    He has no cervical adenopathy.  Neurological: He is alert and oriented to person, place, and time.  Skin: Skin is warm and dry. He is not diaphoretic.  Psychiatric: He has a normal mood and affect. His behavior is normal. Judgment and thought content normal.  Nursing note and vitals reviewed.    UC Treatments / Results  Labs (all labs ordered are listed, but only abnormal results are displayed) Labs Reviewed - No data to display  EKG  EKG Interpretation None       Radiology No results found.  Procedures Procedures (including critical care time)  Medications Ordered in UC Medications - No data to display   Initial Impression / Assessment and Plan / UC Course  I have reviewed  the triage vital signs and the nursing notes.  Pertinent labs & imaging results that were available during my care of the patient were reviewed by me and considered in my medical decision making (see chart for details).     Plan: 1. Test/x-ray results and diagnosis reviewed with patient 2. rx as per orders; risks, benefits, potential side effects reviewed with patient 3. Recommend supportive treatment with stent fluids along with cough and deep breathing.  She states that he is not coughing severely nighttime and daytime forces the coughing to clear his throat and chest of mucus.  I will provide him with a prescription for Tessalon Perles to be filled if he needs it and also with Z-Pak for new symptoms or worsening symptoms that he has at least 7 days from now.  He understands that.  If he worsens he should follow-up in our clinic or with the primary care 4. F/u prn if symptoms worsen or don't improve   Final Clinical Impressions(s) / UC Diagnoses   Final diagnoses:  Acute upper respiratory infection    ED Discharge Orders        Ordered    azithromycin (ZITHROMAX) 250 MG tablet  Daily     11/15/17 2017    benzonatate (TESSALON) 200 MG capsule     11/15/17 2017       Controlled Substance Prescriptions [place] Controlled Substance Registry consulted? Not Applicable   Lutricia FeilRoemer, William P, PA-C 11/15/17 2027
